# Patient Record
Sex: Male | Born: 1950 | Race: White | Hispanic: No | Marital: Married | Smoking: Never smoker
Health system: Southern US, Community
[De-identification: ages and names within clinical notes are randomized; demographics above are authoritative.]

## PROBLEM LIST (undated history)

## (undated) DIAGNOSIS — J302 Other seasonal allergic rhinitis: Secondary | ICD-10-CM

## (undated) DIAGNOSIS — I10 Essential (primary) hypertension: Secondary | ICD-10-CM

## (undated) DIAGNOSIS — K219 Gastro-esophageal reflux disease without esophagitis: Secondary | ICD-10-CM

## (undated) DIAGNOSIS — K449 Diaphragmatic hernia without obstruction or gangrene: Secondary | ICD-10-CM

## (undated) DIAGNOSIS — K648 Other hemorrhoids: Secondary | ICD-10-CM

## (undated) HISTORY — DX: Other seasonal allergic rhinitis: J30.2

## (undated) HISTORY — DX: Gastro-esophageal reflux disease without esophagitis: K21.9

## (undated) HISTORY — DX: Diaphragmatic hernia without obstruction or gangrene: K44.9

## (undated) HISTORY — DX: Other hemorrhoids: K64.8

## (undated) HISTORY — DX: Essential (primary) hypertension: I10

---

## 2004-01-04 HISTORY — PX: CHOLECYSTECTOMY: SHX55

## 2004-02-19 ENCOUNTER — Encounter: Admission: RE | Admit: 2004-02-19 | Discharge: 2004-02-19 | Payer: Self-pay | Admitting: Family Medicine

## 2004-03-11 ENCOUNTER — Encounter (INDEPENDENT_AMBULATORY_CARE_PROVIDER_SITE_OTHER): Payer: Self-pay | Admitting: Specialist

## 2004-03-12 ENCOUNTER — Inpatient Hospital Stay (HOSPITAL_COMMUNITY): Admission: RE | Admit: 2004-03-12 | Discharge: 2004-03-13 | Payer: Self-pay | Admitting: General Surgery

## 2004-05-05 ENCOUNTER — Ambulatory Visit: Payer: Self-pay | Admitting: Gastroenterology

## 2004-05-07 ENCOUNTER — Ambulatory Visit: Payer: Self-pay | Admitting: Gastroenterology

## 2004-06-02 ENCOUNTER — Encounter (INDEPENDENT_AMBULATORY_CARE_PROVIDER_SITE_OTHER): Payer: Self-pay | Admitting: Specialist

## 2004-06-02 ENCOUNTER — Ambulatory Visit: Payer: Self-pay | Admitting: Gastroenterology

## 2004-07-07 ENCOUNTER — Ambulatory Visit: Payer: Self-pay | Admitting: Gastroenterology

## 2004-08-27 ENCOUNTER — Ambulatory Visit: Payer: Self-pay | Admitting: Gastroenterology

## 2004-12-03 ENCOUNTER — Encounter: Admission: RE | Admit: 2004-12-03 | Discharge: 2004-12-03 | Payer: Self-pay | Admitting: Family Medicine

## 2005-02-07 ENCOUNTER — Encounter: Admission: RE | Admit: 2005-02-07 | Discharge: 2005-02-07 | Payer: Self-pay | Admitting: Family Medicine

## 2005-07-20 ENCOUNTER — Ambulatory Visit: Payer: Self-pay | Admitting: Gastroenterology

## 2005-09-13 ENCOUNTER — Encounter: Admission: RE | Admit: 2005-09-13 | Discharge: 2005-09-13 | Payer: Self-pay | Admitting: Family Medicine

## 2005-09-23 ENCOUNTER — Encounter: Admission: RE | Admit: 2005-09-23 | Discharge: 2005-12-22 | Payer: Self-pay | Admitting: Family Medicine

## 2006-05-10 ENCOUNTER — Ambulatory Visit: Payer: Self-pay | Admitting: Gastroenterology

## 2006-05-22 ENCOUNTER — Ambulatory Visit: Payer: Self-pay | Admitting: Gastroenterology

## 2006-07-12 ENCOUNTER — Ambulatory Visit: Payer: Self-pay | Admitting: Gastroenterology

## 2007-04-02 ENCOUNTER — Encounter: Admission: RE | Admit: 2007-04-02 | Discharge: 2007-04-02 | Payer: Self-pay | Admitting: Family Medicine

## 2007-04-09 ENCOUNTER — Encounter: Admission: RE | Admit: 2007-04-09 | Discharge: 2007-04-09 | Payer: Self-pay | Admitting: Family Medicine

## 2007-04-21 DIAGNOSIS — K219 Gastro-esophageal reflux disease without esophagitis: Secondary | ICD-10-CM

## 2007-04-21 DIAGNOSIS — K449 Diaphragmatic hernia without obstruction or gangrene: Secondary | ICD-10-CM | POA: Insufficient documentation

## 2007-04-21 DIAGNOSIS — K8020 Calculus of gallbladder without cholecystitis without obstruction: Secondary | ICD-10-CM | POA: Insufficient documentation

## 2007-04-21 DIAGNOSIS — M7660 Achilles tendinitis, unspecified leg: Secondary | ICD-10-CM | POA: Insufficient documentation

## 2007-04-21 DIAGNOSIS — K648 Other hemorrhoids: Secondary | ICD-10-CM | POA: Insufficient documentation

## 2007-07-17 ENCOUNTER — Encounter: Payer: Self-pay | Admitting: Gastroenterology

## 2007-07-23 ENCOUNTER — Ambulatory Visit: Payer: Self-pay | Admitting: Gastroenterology

## 2007-08-21 ENCOUNTER — Encounter: Admission: RE | Admit: 2007-08-21 | Discharge: 2007-08-21 | Payer: Self-pay | Admitting: Orthopedic Surgery

## 2008-04-19 ENCOUNTER — Emergency Department (HOSPITAL_COMMUNITY): Admission: EM | Admit: 2008-04-19 | Discharge: 2008-04-19 | Payer: Self-pay | Admitting: Emergency Medicine

## 2008-06-23 ENCOUNTER — Telehealth: Payer: Self-pay | Admitting: Gastroenterology

## 2008-06-24 ENCOUNTER — Encounter: Payer: Self-pay | Admitting: Gastroenterology

## 2008-09-12 ENCOUNTER — Ambulatory Visit: Payer: Self-pay | Admitting: Gastroenterology

## 2008-09-12 DIAGNOSIS — R141 Gas pain: Secondary | ICD-10-CM

## 2008-09-12 DIAGNOSIS — R143 Flatulence: Secondary | ICD-10-CM

## 2008-09-12 DIAGNOSIS — R142 Eructation: Secondary | ICD-10-CM

## 2008-11-13 ENCOUNTER — Ambulatory Visit (HOSPITAL_BASED_OUTPATIENT_CLINIC_OR_DEPARTMENT_OTHER): Admission: RE | Admit: 2008-11-13 | Discharge: 2008-11-13 | Payer: Self-pay | Admitting: Internal Medicine

## 2008-11-23 ENCOUNTER — Ambulatory Visit: Payer: Self-pay | Admitting: Internal Medicine

## 2009-07-17 ENCOUNTER — Encounter: Payer: Self-pay | Admitting: Gastroenterology

## 2009-09-28 ENCOUNTER — Ambulatory Visit: Payer: Self-pay | Admitting: Gastroenterology

## 2009-10-02 ENCOUNTER — Ambulatory Visit: Payer: Self-pay | Admitting: Family Medicine

## 2009-10-02 ENCOUNTER — Encounter: Payer: Self-pay | Admitting: Gastroenterology

## 2010-01-24 ENCOUNTER — Encounter: Payer: Self-pay | Admitting: Family Medicine

## 2010-01-27 ENCOUNTER — Encounter
Admission: RE | Admit: 2010-01-27 | Discharge: 2010-01-27 | Payer: Self-pay | Source: Home / Self Care | Attending: Internal Medicine | Admitting: Internal Medicine

## 2010-02-02 NOTE — Medication Information (Signed)
Summary: Approved Pantoprazole / [employer]  Approved Pantoprazole / [employer]   Imported By: Lennie Odor 07/21/2009 11:27:03  _____________________________________________________________________  External Attachment:    Type:   Image     Comment:   External Document

## 2010-02-02 NOTE — Miscellaneous (Signed)
Summary: BONE DENSITY  Clinical Lists Changes  Orders: Added new Test order of T-Lumbar Vertebral Assessment (77082) - Signed 

## 2010-02-02 NOTE — Assessment & Plan Note (Signed)
Summary: FOLLOW UP/RECEIVED A LETTER/YF   History of Present Illness Visit Type: Follow-up Visit Primary GI MD: Elie Goody MD [facility] Primary Provider: Larina Earthly, MD Requesting Provider: n/a Chief Complaint: Patient here for yearly f/u reflux. He denies any new GI problems at this time. History of Present Illness:   This is a 60 year old male with chronic GERD is maintained on pantoprazole twice daily. He states he recently tried to decrease to once daily and his symptoms flared. He underwent upper endoscopy in 2006 that showed a small hiatal hernia and was otherwise normal. He notes problems with frequent intestinal gas and flatus. A trial of Align did not improve his symptoms.   GI Review of Systems      Denies abdominal pain, acid reflux, belching, bloating, chest pain, dysphagia with liquids, dysphagia with solids, heartburn, loss of appetite, nausea, vomiting, vomiting blood, weight loss, and  weight gain.        Denies anal fissure, black tarry stools, change in bowel habit, constipation, diarrhea, diverticulosis, fecal incontinence, heme positive stool, hemorrhoids, irritable bowel syndrome, jaundice, light color stool, liver problems, rectal bleeding, and  rectal pain.   Current Medications (verified): 1)  Pantoprazole Sodium 40 Mg  Tbec (Pantoprazole Sodium) .... Take 1 Tablet By Mouth Two Times A Day 2)  Norvasc 10 Mg  Tabs (Amlodipine Besylate) .... Take 1 Tablet By Mouth Once A Day 3)  Lisinopril-Hydrochlorothiazide 20-12.5 Mg Tabs (Lisinopril-Hydrochlorothiazide) .... Take 1 Tablet By Mouth Once A Day 4)  Lyrica 50 Mg Caps (Pregabalin) .... Take 1 Tablet By Mouth Two Times A Day  Allergies (verified): 1)  Sulfa  Past History:  Past Medical History: GERD Hemorrhoids, internal HTN Asthma Sleep apnea Allergies Hiatal Hernia  Past Surgical History: Cholecystectomy 2006  Family History: Reviewed history from 09/12/2008 and no changes required. Ulcerative  Colitis-Father Family History of Heart Disease: Father, Mother No FH of Colon Cancer:  Social History: Reviewed history from 07/23/2007 and no changes required. Married Occupation: Education @ [employer] Patient has never smoked.  Alcohol Use - no Illicit Drug Use - no Patient does not get regular exercise.   Review of Systems       The patient complains of allergy/sinus and arthritis/joint pain.         The pertinent positives and negatives are noted as above and in the HPI. All other ROS were reviewed and were negative.   Vital Signs:  Patient profile:   60 year old male Height:      67.5 inches Weight:      202.38 pounds BMI:     31.34 BSA:     2.05 Pulse rate:   76 / minute Pulse rhythm:   regular BP sitting:   130 / 76  (right arm) Cuff size:   regular  Vitals Entered By: Lamona Curl CMA Duncan Dull) (September 28, 2009 3:06 PM)  Physical Exam  General:  Well developed, well nourished, no acute distress. Head:  Normocephalic and atraumatic. Eyes:  PERRLA, no icterus. Ears:  Normal auditory acuity. Mouth:  No deformity or lesions, dentition normal. Neck:  Supple; no masses or thyromegaly. Lungs:  Clear throughout to auscultation. Heart:  Regular rate and rhythm; no murmurs, rubs,  or bruits. Abdomen:  Soft, nontender and nondistended. No masses, hepatosplenomegaly or hernias noted. Normal bowel sounds. Msk:  Symmetrical with no gross deformities. Normal posture. Pulses:  Normal pulses noted. Extremities:  No clubbing, cyanosis, edema or deformities noted. Neurologic:  Alert and  oriented x4;  grossly normal neurologically. Cervical Nodes:  No significant cervical adenopathy. Inguinal Nodes:  No significant inguinal adenopathy. Psych:  Alert and cooperative. Normal mood and affect.  Impression & Recommendations:  Problem # 1:  GASTROESOPHAGEAL REFLUX DISEASE (ICD-530.81) Continue pantoprazole 40 mg twice daily, and standard antireflux measures. Rule out  osteopenia/osteoporosis, given long term PPI usage. Orders: T-Bone Densitometry [phone])  Problem # 2:  FLATULENCE-GAS-BLOATING (ICD-787.3) Begin a low gas diet. Trial of Gas-X q.i.d. p.r.n.  Problem # 3:  SCREENING COLORECTAL-CANCER (ICD-V76.51) Average risk for colorectal cancer. Screening colonoscopy due in May 2018.  Patient Instructions: 1)  Pick up your prescription from your pharmacy. 2)  Excessive Gas Diet handout given.  3)  Use Gas-X four times a day as needed. 4)  You have been scheduled for a Bone Density Scan. 5)  Copy sent to : Larina Earthly, MD 6)  The medication list was reviewed and reconciled.  All changed / newly prescribed medications were explained.  A complete medication list was provided to the patient / caregiver.  Prescriptions: PANTOPRAZOLE SODIUM 40 MG  TBEC (PANTOPRAZOLE SODIUM) Take 1 tablet by mouth two times a day  #60 x 11   Entered by:   Christie Nottingham CMA (AAMA)   Authorized by:   Meryl Dare MD [facility]   Signed by:   Christie Nottingham CMA (AAMA) on 09/28/2009   Method used:   Electronically to        [facility]  [address] [id number] (retail)       [address]       [place], [place]  [address]       Ph: [phone] or [phone]       Fax: [phone]   RxID:   [id number]

## 2010-05-18 NOTE — Assessment & Plan Note (Signed)
[facility]                         GASTROENTEROLOGY OFFICE NOTE   NAME:Faye, FARRIS GEIMAN                        MRN:          [id number]  DATE:07/12/2006                            DOB:          1950/03/22    Mr. [name] [name] returns for followup of GERD.  He states his reflux symptoms  are under very good control and he only has occasional breakthrough  symptoms.  Pantoprazole 40 mg twice a day appears to be an effective  regimen for him.  We reviewed the results of his recent colonoscopy that  only revealed small internal hemorrhoids which were likely the source of  his Hemoccult positive stools.   CURRENT MEDICATIONS:  Listed on the chart, updated and reviewed.   MEDICATION ALLERGIES:  SULFA DRUGS LEADING TO DYSPEPSIA.   EXAMINATION:  No acute distress, weight 188.4 pounds, blood pressure is  127/68, pulse 72 and regular.  He is not re-examined.   ASSESSMENT/PLAN:  1. Gastroesophageal reflux disease.  Continue standard anti-reflux      measures and pantoprazole 40 mg p.o. b.i.d.  2. Small internal hemorrhoids.  He may use an over the counter      hemorrhoidal suppository or cream on a p.r.n. basis.  3. Colorectal cancer screening. Recall colonoscopy in May 2018.     Venita Lick. Russella Dar, MD, [facility]  Electronically Signed    MTS/MedQ  DD: 07/14/2006  DT: 07/14/2006  Job #: [id number]   cc:   Quita Skye. Artis Flock, M.D.

## 2010-05-21 NOTE — Op Note (Signed)
[name] [name], [name] [name] NO.:  [id number]   MEDICAL RECORD NO.:  [id number]          PATIENT TYPE:  AMB   LOCATION:  DAY                          FACILITY:  [facility]   PHYSICIAN:  Angelia Mould. Derrell Lolling, M.D.DATE OF BIRTH:  03-08-1950   DATE OF PROCEDURE:  03/11/2004  DATE OF DISCHARGE:                                 OPERATIVE REPORT   PREOPERATIVE DIAGNOSIS:  Subacute cholecystitis with cholelithiasis.   POSTOPERATIVE DIAGNOSIS:  Subacute cholecystitis with cholelithiasis.   OPERATION PERFORMED:  Laparoscopic cholecystectomy with intraoperative  cholangiogram.   OPERATIVE INDICATIONS:  This is a 60 year old white man who has a long-  standing history of reflux symptoms.  These were getting worse about 6 weeks  ago.  An abdominal ultrasound was performed on February 19, 2004, which  shows multiple gallstones but no other abnormalities.  He also had an upper  GI, which showed a small hiatal hernia but no reflux.  He has been trying  proton pump inhibitors and H2 blockers, which did not help.  He has changed  his diet, which did help somewhat.  More recently, he has a 10-day history  of right-sided abdominal pain, mostly in the right upper quadrant.  It  radiates around to the back.  A little bit of nausea but no vomiting and no  fever.  I saw him in the office yesterday.  He had blood work, which showed  a white blood cell count of 14,000 and normal liver function tests.  He is  on antibiotics.  He did not look acutely ill, but because of the  leukocytosis and the mild tenderness, I felt it best to proceed with  cholecystectomy at this time.   OPERATIVE FINDINGS:  The gallbladder was a little bit thick walled but  mostly looked like chronic inflammatory changes.  The wall was not  particularly thickened.  There were a lot of adhesions to the gallbladder,  suggesting prior inflammatory episodes.  The gallbladder did contain  gallstones.  The anatomy of the cystic duct,  cystic artery, and common bile  duct was conventional.  The cholangiogram was normal except for one tiny  defect, which was completely spherical and looked like an air bubble in the  mid duct.  There was no obstruction.  The contrast went into the duodenum.  The intrahepatic and extrahepatic bile ducts looked normal.  The radiologist  read the cholangiogram as most likely air bubble but cannot rule out other  filling defect.  We did not feel that a common bile duct exploration was  warranted based on these findings.  The stomach, duodenum, liver, large  intestine, and small intestine were grossly normal.  We did look at the  right colon and the appendix specifically and there was no abnormality  there.  No signs of inflammatory process.   OPERATIVE TECHNIQUE:  Following the induction of general endotracheal  anesthesia, the patient's abdomen was prepped and draped in the sterile  fashion.  He was given intravenous antibiotics.  The patient was identified.  Marcaine, 0.5%, with epinephrine was used as a local infiltration  anesthetic.  A vertically oriented incision was made at the lower rim of the  umbilicus.  The fascia was incised in the midline and the abdominal cavity  entered under direct vision.  A 10 mm Hasson trocar was inserted and secured  with a purse-string suture of 0 Vicryl.  Pneumoperitoneum was created.  The  video camera was inserted with visualization and findings as described  above.  A 10 mm trocar was placed in the subxiphoid region and two 5 mm  trocars placed in the right mid abdomen.  The gallbladder fundus was  elevated.  We took down all of the adhesions slowly.  We identified the  infundibulum and retracted it laterally.  We then dissected out the cystic  duct and the cystic artery.  The cystic artery was isolated as it went onto  the gallbladder wall, secured with multiple metal clips, and divided.  This  then created a very nice window behind the cystic duct  and we had a very  good critical view.  The cystic duct was secured with the metal clip close  to the gallbladder.  A cholangiogram catheter was inserted into the cystic  duct.  A cholangiogram was obtained using the C-arm.  This showed normal  intrahepatic and extrahepatic biliary anatomy, prompt flow of contrast in  the duodenum.  There was a tiny 3 mm perfectly round defect in the mid  common bile duct, which looked like an air bubble.  The radiologist agreed  with this.  I felt that this could be followed symptomatically.  We did not  think that exploration of the common bile duct was indicated.  The  cholangiogram catheter was removed.  The cystic duct was secured with  multiple metal clips and divided.  The gallbladder was dissected from its  bed with electrocautery, placed in the specimen bag, and removed.  We had  spilled a little bit of bile from the gallbladder.  We spent a fair amount  of time irrigating and cleaning all of that up.  At the completion of the  case, the irrigation fluid was completely clear.  At the completion of the  case, there was absolutely no bleeding and no bile leak whatsoever.  All of  the areas of operative dissection and trocar site placements were inspected.  There was no bleeding and everything looked fine.  The trocars were removed.  The pneumoperitoneum was released.  The fascia at the umbilicus was closed  with 0 Vicryl sutures.  The skin incisions were closed with subcuticular  sutures of 4-0 Vicryl and Steri-Strips.  Clean bandages were placed and the  patient taken to the recovery room in stable condition.  Estimated blood  loss was about 20 cc.  Complications none.  Sponge and instrument counts  were correct.      HMI/MEDQ  D:  03/11/2004  T:  03/11/2004  Job:  [id number]   cc:   Vale Haven. Andrey Campanile, M.D.  [address]  [place]  [place] [address]  Fax: [phone]

## 2010-05-21 NOTE — Assessment & Plan Note (Signed)
[facility]                           GASTROENTEROLOGY OFFICE NOTE   NAME:[name] [name] HASLER                        MRN:          [id number]  DATE:07/20/2005                            DOB:          1950/02/25    This is a return office visit for GERD.  His reflux symptoms remain well  controlled on Protonix b.i.d. with the p.r.n. use of Tums.  He has no  dysphagia, odynophagia, abdominal pain, weight loss, or gastrointestinal  bleeding.  He has no abdominal pain, change in bowel habits, or change in  stool caliber.  There is no family history of colon cancer.  We have  previously recommended screening colonoscopy but he has not responded.  I  did discuss this again with him today.  Medications listed on the chart  updated and reviewed.   ALLERGIES:  SULFA DRUGS leading to dyspepsia.   PHYSICAL EXAMINATION:  GENERAL:  No acute distress.  VITAL SIGNS:  Blood pressure is 120/60, pulse 82 and regular.  CHEST:  Clear to auscultation bilaterally.  CARDIAC:  Regular rate and rhythm without murmurs.  ABDOMEN:  Soft and nontender with normoactive bowel sounds.  No palpable  organomegaly, masses, or hernias.   ASSESSMENT AND PLAN:  1.  Gastroesophageal reflux disease, well controlled on Protonix b.i.d.      Continue current regimen.  2.  Colorectal cancer screening.  The patient declines colonoscopy, barium      enema, and virtual colonoscopy at this time.  He states he will consider      it again in the future.  His primary physician is now Dr. Bradd Canary.      Return office visit with me in one year.                                   Venita Lick. Pleas Koch., MD, [facility]   MTS/MedQ  DD:  07/20/2005  DT:  07/20/2005  Job #:  [id number]

## 2010-09-25 ENCOUNTER — Other Ambulatory Visit: Payer: Self-pay | Admitting: Gastroenterology

## 2010-10-03 ENCOUNTER — Other Ambulatory Visit: Payer: Self-pay | Admitting: Gastroenterology

## 2010-10-04 NOTE — Telephone Encounter (Signed)
NEEDS OFFICE VISIT FOR ANY FURTHER REFILLS! 

## 2011-01-19 ENCOUNTER — Other Ambulatory Visit: Payer: Self-pay | Admitting: Internal Medicine

## 2011-01-30 ENCOUNTER — Other Ambulatory Visit: Payer: Self-pay | Admitting: Internal Medicine

## 2011-02-03 ENCOUNTER — Telehealth: Payer: Self-pay | Admitting: Gastroenterology

## 2011-02-03 MED ORDER — PANTOPRAZOLE SODIUM 40 MG PO TBEC: 40.0000 mg | DELAYED_RELEASE_TABLET | Freq: Two times a day (BID) | ORAL | Status: DC

## 2011-02-03 NOTE — Telephone Encounter (Signed)
One refill sent to his pharmacy until scheduled office visit.

## 2011-02-21 ENCOUNTER — Encounter: Payer: Self-pay | Admitting: Gastroenterology

## 2011-02-21 ENCOUNTER — Ambulatory Visit (INDEPENDENT_AMBULATORY_CARE_PROVIDER_SITE_OTHER): Payer: BC Managed Care – PPO | Admitting: Gastroenterology

## 2011-02-21 VITALS — BP 110/60 | HR 62 | Ht 67.0 in | Wt 200.8 lb

## 2011-02-21 DIAGNOSIS — Z1211 Encounter for screening for malignant neoplasm of colon: Secondary | ICD-10-CM

## 2011-02-21 DIAGNOSIS — K219 Gastro-esophageal reflux disease without esophagitis: Secondary | ICD-10-CM

## 2011-02-21 MED ORDER — PANTOPRAZOLE SODIUM 40 MG PO TBEC: 40.0000 mg | DELAYED_RELEASE_TABLET | Freq: Two times a day (BID) | ORAL | Status: DC

## 2011-02-21 NOTE — Progress Notes (Signed)
History of Present Illness: This is a 61 year old male with a history of GERD that is controlled on pantoprazole 40 mg twice daily he has attempted on several occasions over the years to decrease the pantoprazole once daily but has significant breakthrough symptoms and returns to twice daily therapy. He uses TUMS as needed. He has infrequent episodes of intestinal gas. Denies weight loss, abdominal pain, constipation, diarrhea, change in stool caliber, melena, hematochezia, nausea, vomiting, dysphagia, chest pain.  Current Medications, Allergies, Past Medical History, Past Surgical History, Family History and Social History were reviewed in [employer] record.  Physical Exam: General: Well developed , well nourished, no acute distress Head: Normocephalic and atraumatic Eyes:  sclerae anicteric, EOMI Ears: Normal auditory acuity Mouth: No deformity or lesions Lungs: Clear throughout to auscultation Heart: Regular rate and rhythm; no murmurs, rubs or bruits Abdomen: Soft, non tender and non distended. No masses, hepatosplenomegaly or hernias noted. Normal Bowel sounds Musculoskeletal: Symmetrical with no gross deformities  Extremities: No clubbing, cyanosis, edema or deformities noted Neurological: Alert oriented x 4, grossly nonfocal Psychological:  Alert and cooperative. Normal mood and affect  Assessment and Recommendations:  1. GERD. Continue pantoprazole 40 mg twice daily, TUMS when necessary and standard antireflux measures.  2. Colorectal cancer screening. Average risk. Screening colonoscopy is due May 2018.

## 2011-02-21 NOTE — Patient Instructions (Addendum)
Your prescription for Protonix has been sent to the phamacy.  cc: Ravisankar Awa M.D.

## 2011-08-19 ENCOUNTER — Encounter (INDEPENDENT_AMBULATORY_CARE_PROVIDER_SITE_OTHER): Payer: Self-pay | Admitting: General Surgery

## 2011-08-19 ENCOUNTER — Ambulatory Visit (INDEPENDENT_AMBULATORY_CARE_PROVIDER_SITE_OTHER): Payer: BC Managed Care – PPO | Admitting: General Surgery

## 2011-08-19 VITALS — BP 120/80 | HR 56 | Temp 97.4°F | Resp 16 | Ht 67.0 in | Wt 194.0 lb

## 2011-08-19 DIAGNOSIS — K645 Perianal venous thrombosis: Secondary | ICD-10-CM

## 2011-08-19 NOTE — Progress Notes (Signed)
Subjective:     Patient ID: [name] [name], male   DOB: [birth year], 61 y.o.   MRN: [id number]  HPI Patient complains of thrombosed external hemorrhoids with significant pain for the past 2 days. He was seen at his primary care office and referred to urgent clinic. He has had no bleeding. He has had no significant recent constipation.  Review of Systems     Objective:   Physical Exam  HENT:       Wears glasses  Cardiovascular: Normal rate and regular rhythm.   Pulmonary/Chest: Effort normal. No respiratory distress.   External anal exam reveals 2 very large thrombosed external hemorrhoids on the right side. Area was prepped in a sterile fashion. Local anesthetic was injected. An incision was made over both with expulsion of the large clots spontaneously. Further clots were cleaned out. Hemostasis was obtained with pressure and a gauze dressing was applied. He tolerated this well.    Assessment:     Thrombosed external hemorrhoid x2    Plan:     Excision as above. Wound care instructions given. Return p.r.n.

## 2011-08-22 ENCOUNTER — Telehealth (INDEPENDENT_AMBULATORY_CARE_PROVIDER_SITE_OTHER): Payer: Self-pay

## 2011-08-22 NOTE — Telephone Encounter (Signed)
Patient called with a couple questions from his throm hem excision on Friday. He states there is still a small area that is hanging down and asked if it was normal. I told him that Dr. Janee Morn cut the hemorrhoids open and pulled out the blood clots. So the area that was holding the clots is probably still down there. I told him to do the sitz baths still. He also inquired about pain medicine. He currently is not taking anything. I told him to try ibuprofen because a narcotic could possibly constipate him and since he has hemorrhoid problems the last thing he needs is to be constipated for risk of continued hemorrhoidal problems. Patient understood and will call back with any questions, concerns or worsening symptoms.

## 2011-09-09 ENCOUNTER — Telehealth: Payer: Self-pay | Admitting: Gastroenterology

## 2011-09-09 NOTE — Telephone Encounter (Signed)
Called and got patient's PA covered today. Faxed the approved form to patient's pharmacy. Pt notified.

## 2012-02-24 ENCOUNTER — Other Ambulatory Visit: Payer: Self-pay | Admitting: Gastroenterology

## 2012-02-24 NOTE — Telephone Encounter (Signed)
NEEDS OFFICE VISIT FOR ANY FURTHER REFILLS! 

## 2012-04-07 ENCOUNTER — Other Ambulatory Visit: Payer: Self-pay | Admitting: Gastroenterology

## 2012-04-09 NOTE — Telephone Encounter (Signed)
NEEDS OFFICE VISIT FOR ANY FURTHER REFILLS! 

## 2012-05-10 ENCOUNTER — Telehealth: Payer: Self-pay | Admitting: Gastroenterology

## 2012-05-10 ENCOUNTER — Other Ambulatory Visit: Payer: Self-pay | Admitting: Gastroenterology

## 2012-05-10 MED ORDER — PANTOPRAZOLE SODIUM 40 MG PO TBEC: DELAYED_RELEASE_TABLET | ORAL | Status: DC

## 2012-05-10 NOTE — Telephone Encounter (Signed)
Refilled medicine for one month until scheduled appt. Told patient to keep appt for any further refills.

## 2012-05-23 ENCOUNTER — Ambulatory Visit (INDEPENDENT_AMBULATORY_CARE_PROVIDER_SITE_OTHER): Payer: BC Managed Care – PPO | Admitting: Gastroenterology

## 2012-05-23 ENCOUNTER — Encounter: Payer: Self-pay | Admitting: Gastroenterology

## 2012-05-23 VITALS — BP 100/56 | HR 56 | Ht 67.0 in | Wt 191.4 lb

## 2012-05-23 DIAGNOSIS — K219 Gastro-esophageal reflux disease without esophagitis: Secondary | ICD-10-CM

## 2012-05-23 MED ORDER — PANTOPRAZOLE SODIUM 40 MG PO TBEC: DELAYED_RELEASE_TABLET | ORAL | Status: DC

## 2012-05-23 MED ORDER — HYOSCYAMINE SULFATE 0.125 MG SL SUBL: SUBLINGUAL_TABLET | SUBLINGUAL | Status: DC

## 2012-05-23 NOTE — Patient Instructions (Addendum)
We have sent the following medications to your pharmacy for you to pick up at your convenience: Levsin and Protonix.  Thank you for choosing me and  Gastroenterology.  Venita Lick. Pleas Koch., MD., [facility]

## 2012-05-23 NOTE — Progress Notes (Signed)
History of Present Illness: This is a 62 year old male with chronic GERD that is well controlled on pantoprazole 40 twice daily. He relates a history of episodic diarrhea since about 62 years of age that is clearly related to certain food intake. Milk products, caffeine, spicy foods and red meats frequently bring on symptoms of gas cramping and diarrhea that resolves within a few hours  Current Medications, Allergies, Past Medical History, Past Surgical History, Family History and Social History were reviewed in [employer] record.  Physical Exam: General: Well developed , well nourished, no acute distress Head: Normocephalic and atraumatic Eyes:  sclerae anicteric, EOMI Ears: Normal auditory acuity Mouth: No deformity or lesions Lungs: Clear throughout to auscultation Heart: Regular rate and rhythm; no murmurs, rubs or bruits Abdomen: Soft, non tender and non distended. No masses, hepatosplenomegaly or hernias noted. Normal Bowel sounds Musculoskeletal: Symmetrical with no gross deformities  Pulses:  Normal pulses noted Extremities: No clubbing, cyanosis, edema or deformities noted Neurological: Alert oriented x 4, grossly nonfocal Psychological:  Alert and cooperative. Normal mood and affect  Assessment and Recommendations:  1. GERD. Continue pantoprazole 40 mg twice daily, TUMS when necessary and standard antireflux measures.   2. Colorectal cancer screening. Average risk. Screening colonoscopy is due May 2018.  3. Episodic diarrhea with related to food intolerances. Avoidance or at least minimizing these foods is recommended. Levsin as needed for symptoms and prophylactically prior to stressor foods.

## 2012-06-03 ENCOUNTER — Ambulatory Visit (INDEPENDENT_AMBULATORY_CARE_PROVIDER_SITE_OTHER): Payer: BC Managed Care – PPO | Admitting: Physician Assistant

## 2012-06-03 VITALS — BP 118/65 | HR 50 | Temp 97.9°F | Resp 12 | Ht 67.0 in | Wt 192.0 lb

## 2012-06-03 DIAGNOSIS — H902 Conductive hearing loss, unspecified: Secondary | ICD-10-CM

## 2012-06-03 DIAGNOSIS — H6120 Impacted cerumen, unspecified ear: Secondary | ICD-10-CM

## 2012-06-03 DIAGNOSIS — H6123 Impacted cerumen, bilateral: Secondary | ICD-10-CM

## 2012-06-03 NOTE — Patient Instructions (Addendum)
Discontinue use of Q-tips, hair pins, keys, etc.   To help prevent cerumen impaction: While in the washing hair in the shower, allow soapy water to run into ear canal for a few minutes and then rinse (it dissolves the wax like a dishwasher dissolves grease).   May also use half hydrogen peroxide half water in the shower 2-3 times per week to help rinse out the wax. DO NOT USE 100% HYDROGEN PEROXIDE (this can burn the skin).   Also, several drops of sweet oil can be used to help prevent itching of the canal. If this is not enough to decrease itch, you may put a small amount of OTC hydrocortisone cream on pinky finger and apply to portion of canal that can be reached with tip of finger.  Recommend OTC Colace to prevent impaction.

## 2012-06-03 NOTE — Progress Notes (Signed)
  Subjective:    Patient ID: [name] [name], male    DOB: January 04, 1951, 62 y.o.   MRN: [id number]  HPI 62 year old male presents with cerumen impaction. Does have a significant history of cerumen impaction that does require irrigation on occasion. He does use Debrox at home and also irrigates with a syringe. He has been doing this without success and was unable to get in to his PCP so he has presented here.  Denies otalgia, URI sx's, dizziness, or headache.   Otherwise doing well with no other complaints today.     Review of Systems  Constitutional: Negative for fever and chills.  HENT: Negative for ear pain.   Neurological: Negative for dizziness and headaches.       Objective:   Physical Exam  Constitutional: He is oriented to person, place, and time. He appears well-developed and well-nourished.  HENT:  Head: Normocephalic and atraumatic.  Right Ear: External ear and ear canal normal.  Left Ear: External ear and ear canal normal.  Bilateral cerumen impaction. Normal TM's visualized s/p irrigation  Cardiovascular: Normal rate.   Pulmonary/Chest: Effort normal.  Neurological: He is alert and oriented to person, place, and time.  Psychiatric: He has a normal mood and affect. His behavior is normal. Judgment and thought content normal.      Patient reports improvement in symptoms after irrigation.     Assessment & Plan:  Cerumen impaction, bilateral  Conductive hearing loss  Ear care instructions provided Follow up as needed.

## 2012-12-01 ENCOUNTER — Ambulatory Visit (INDEPENDENT_AMBULATORY_CARE_PROVIDER_SITE_OTHER): Payer: BC Managed Care – PPO | Admitting: Internal Medicine

## 2012-12-01 VITALS — BP 128/72 | HR 58 | Temp 98.6°F | Resp 14 | Ht 67.0 in | Wt 189.0 lb

## 2012-12-01 DIAGNOSIS — R05 Cough: Secondary | ICD-10-CM

## 2012-12-01 DIAGNOSIS — J019 Acute sinusitis, unspecified: Secondary | ICD-10-CM

## 2012-12-01 DIAGNOSIS — I10 Essential (primary) hypertension: Secondary | ICD-10-CM | POA: Insufficient documentation

## 2012-12-01 DIAGNOSIS — Z6829 Body mass index (BMI) 29.0-29.9, adult: Secondary | ICD-10-CM

## 2012-12-01 MED ORDER — LIDOCAINE VISCOUS 2 % MT SOLN: OROMUCOSAL | Status: DC

## 2012-12-01 MED ORDER — AMOXICILLIN 500 MG PO CAPS: 1000.0000 mg | ORAL_CAPSULE | Freq: Two times a day (BID) | ORAL | Status: AC

## 2012-12-01 NOTE — Progress Notes (Deleted)
   Subjective:    Patient ID: [name] [name], male    DOB: 1950-03-12, 62 y.o.   MRN: [id number]  HPI    Review of Systems     Objective:   Physical Exam        Assessment & Plan:

## 2012-12-02 DIAGNOSIS — Z6829 Body mass index (BMI) 29.0-29.9, adult: Secondary | ICD-10-CM | POA: Insufficient documentation

## 2012-12-02 NOTE — Progress Notes (Signed)
   Subjective:    Patient ID: [name] [name], male    DOB: [birth year], 62 y.o.   MRN: [id number]  HPI 2 wek hx congestion/purulent mucus/nonpro cough No fever Hoarse at times No AR  Patient Active Problem List   Diagnosis Date Noted  . BMI 29.0-29.9,adult 12/02/2012  . HTN (hypertension) 12/01/2012  . FLATULENCE-GAS-BLOATING 09/12/2008  . HEMORRHOIDS, INTERNAL 04/21/2007  . GASTROESOPHAGEAL REFLUX DISEASE 04/21/2007  . HIATAL HERNIA 04/21/2007  . CHOLELITHIASIS 04/21/2007  . ACHILLES BURSITIS OR TENDINITIS 04/21/2007      Review of Systems    noncontr Objective:   Physical Exam BP 128/72  Pulse 58  Temp(Src) 98.6 F (37 C)  Resp 14  Ht 5\' 7"  (1.702 m)  Wt 189 lb (85.73 kg)  BMI 29.59 kg/m2  SpO2 98% Tms cl Nares boggy/purulent Tend max sin R thr cl No nodes Chest clear        Assessment & Plan:  Acute sinusitis, unspecified  BMI 29.0-29.9,adult  Meds ordered this encounter  Medications  . amoxicillin (AMOXIL) 500 MG capsule    Sig: Take 2 capsules (1,000 mg total) by mouth 2 (two) times daily.    Dispense:  40 capsule    Refill:  0  . lidocaine (XYLOCAINE) 2 % solution    Sig: gargle with 1/2-1 tsp q 2-3 hrs as pain control    Dispense:  100 mL    Refill:  0

## 2013-05-28 ENCOUNTER — Ambulatory Visit (INDEPENDENT_AMBULATORY_CARE_PROVIDER_SITE_OTHER): Payer: BC Managed Care – PPO | Admitting: Gastroenterology

## 2013-05-28 ENCOUNTER — Encounter: Payer: Self-pay | Admitting: Gastroenterology

## 2013-05-28 VITALS — BP 118/70 | HR 60 | Ht 67.0 in | Wt 190.4 lb

## 2013-05-28 DIAGNOSIS — K219 Gastro-esophageal reflux disease without esophagitis: Secondary | ICD-10-CM

## 2013-05-28 DIAGNOSIS — K589 Irritable bowel syndrome without diarrhea: Secondary | ICD-10-CM

## 2013-05-28 MED ORDER — PANTOPRAZOLE SODIUM 40 MG PO TBEC: DELAYED_RELEASE_TABLET | ORAL | Status: AC

## 2013-05-28 NOTE — Progress Notes (Signed)
    History of Present Illness: This is a 63 year old male with GERD and IBS. His PCP started Effexor and his IBS symptoms are under better control. His reflux symptoms are under excellent control.   Current Medications, Allergies, Past Medical History, Past Surgical History, Family History and Social History were reviewed in [employer] record.  Physical Exam: General: Well developed , well nourished, no acute distress Head: Normocephalic and atraumatic Eyes:  sclerae anicteric, EOMI Ears: Normal auditory acuity Mouth: No deformity or lesions Lungs: Clear throughout to auscultation Heart: Regular rate and rhythm; no murmurs, rubs or bruits Abdomen: Soft, non tender and non distended. No masses, hepatosplenomegaly or hernias noted. Normal Bowel sounds Musculoskeletal: Symmetrical with no gross deformities  Pulses:  Normal pulses noted Extremities: No clubbing, cyanosis, edema or deformities noted Neurological: Alert oriented x 4, grossly nonfocal Psychological:  Alert and cooperative. Normal mood and affect  Assessment and Recommendations:  1. GERD. Continue standard antireflux measures and pantoprazole 40 mg twice daily.  2. IBS. Symptoms improved on Effexor. Continue hyoscyamine as needed.

## 2013-05-28 NOTE — Patient Instructions (Signed)
We have sent the following medications to your pharmacy for you to pick up at your convenience: Protonix.  Thank you for choosing me and [facility].  Malcolm T. Stark, Jr., MD., FACG   

## 2013-07-07 ENCOUNTER — Other Ambulatory Visit: Payer: Self-pay | Admitting: Gastroenterology

## 2016-03-30 ENCOUNTER — Encounter: Payer: Self-pay | Admitting: Gastroenterology

## 2017-01-24 IMAGING — CR FOOT LT 2 VWS
1 series · 2 of 2 positions shown · non-contrast
Comparison: None

Left foot 2 views weightbearing
HISTORY: Pain

[Series 1: lat · 0.17mm/px · 2 of 2 slices shown]
[im 1/2]
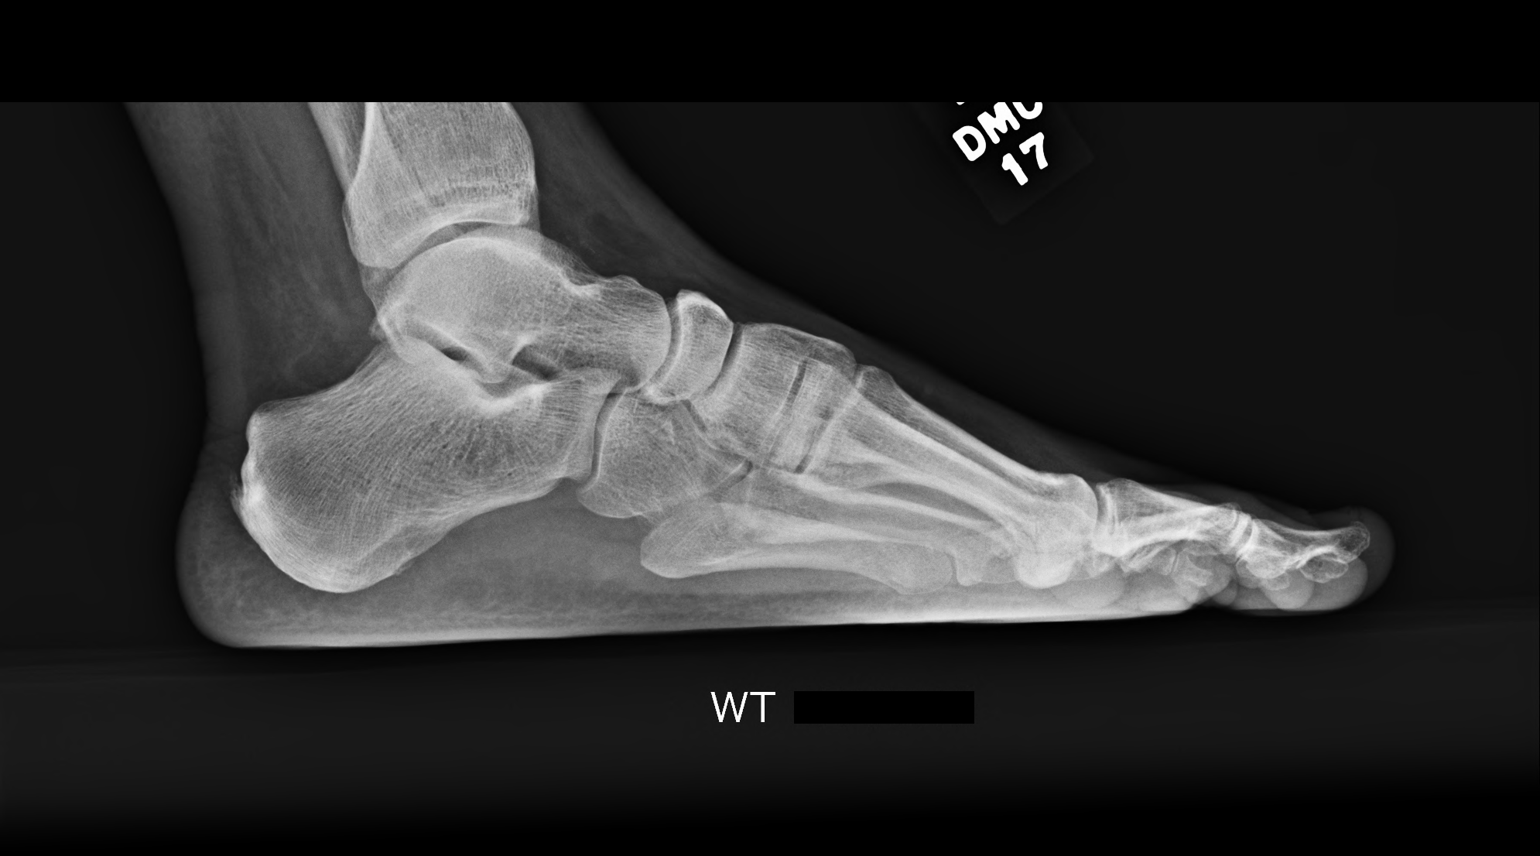
[im 2/2]
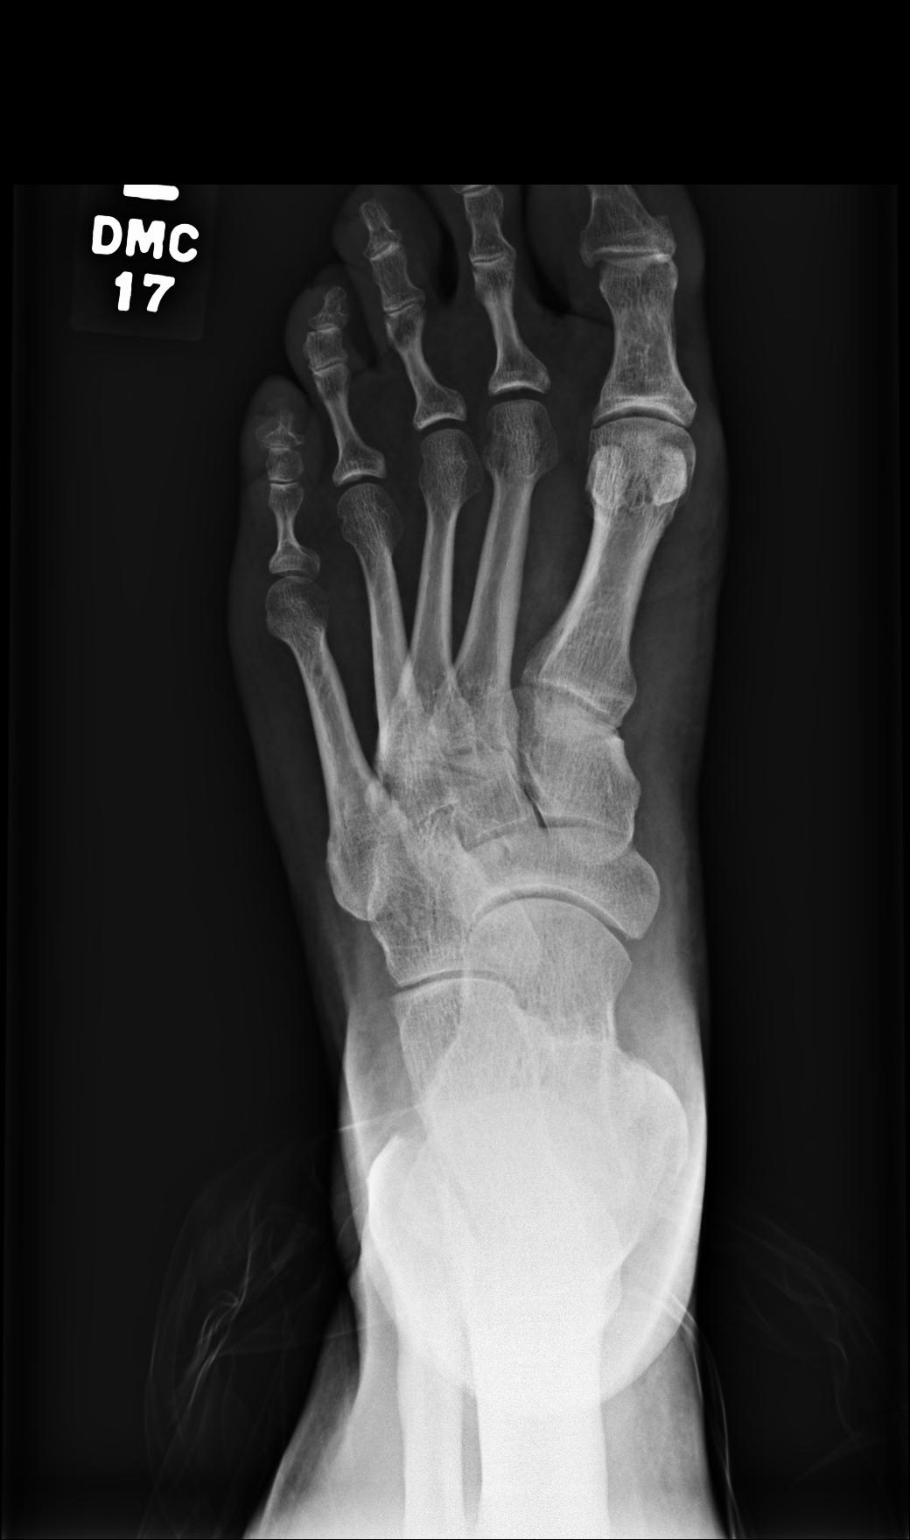

[2 of 2 positions shown; findings below may reference images not displayed]

FINDINGS: No bony or articular abnormality is identified. In particular, there is no evidence of fracture, dislocation, or radiopaque foreign body.
IMPRESSION: Normal left foot.

## 2022-11-24 IMAGING — CR CHEST 2 VWS PA LAT
1 series · 2 of 2 positions shown · non-contrast
Comparison: None

HISTORY/INDICATIONS:  Shortness of breath
TECHNIQUE: Chest, two views.

[Series 1: w chest pa · 0.15mm/px · 2 of 2 slices shown]
[im 1/2]
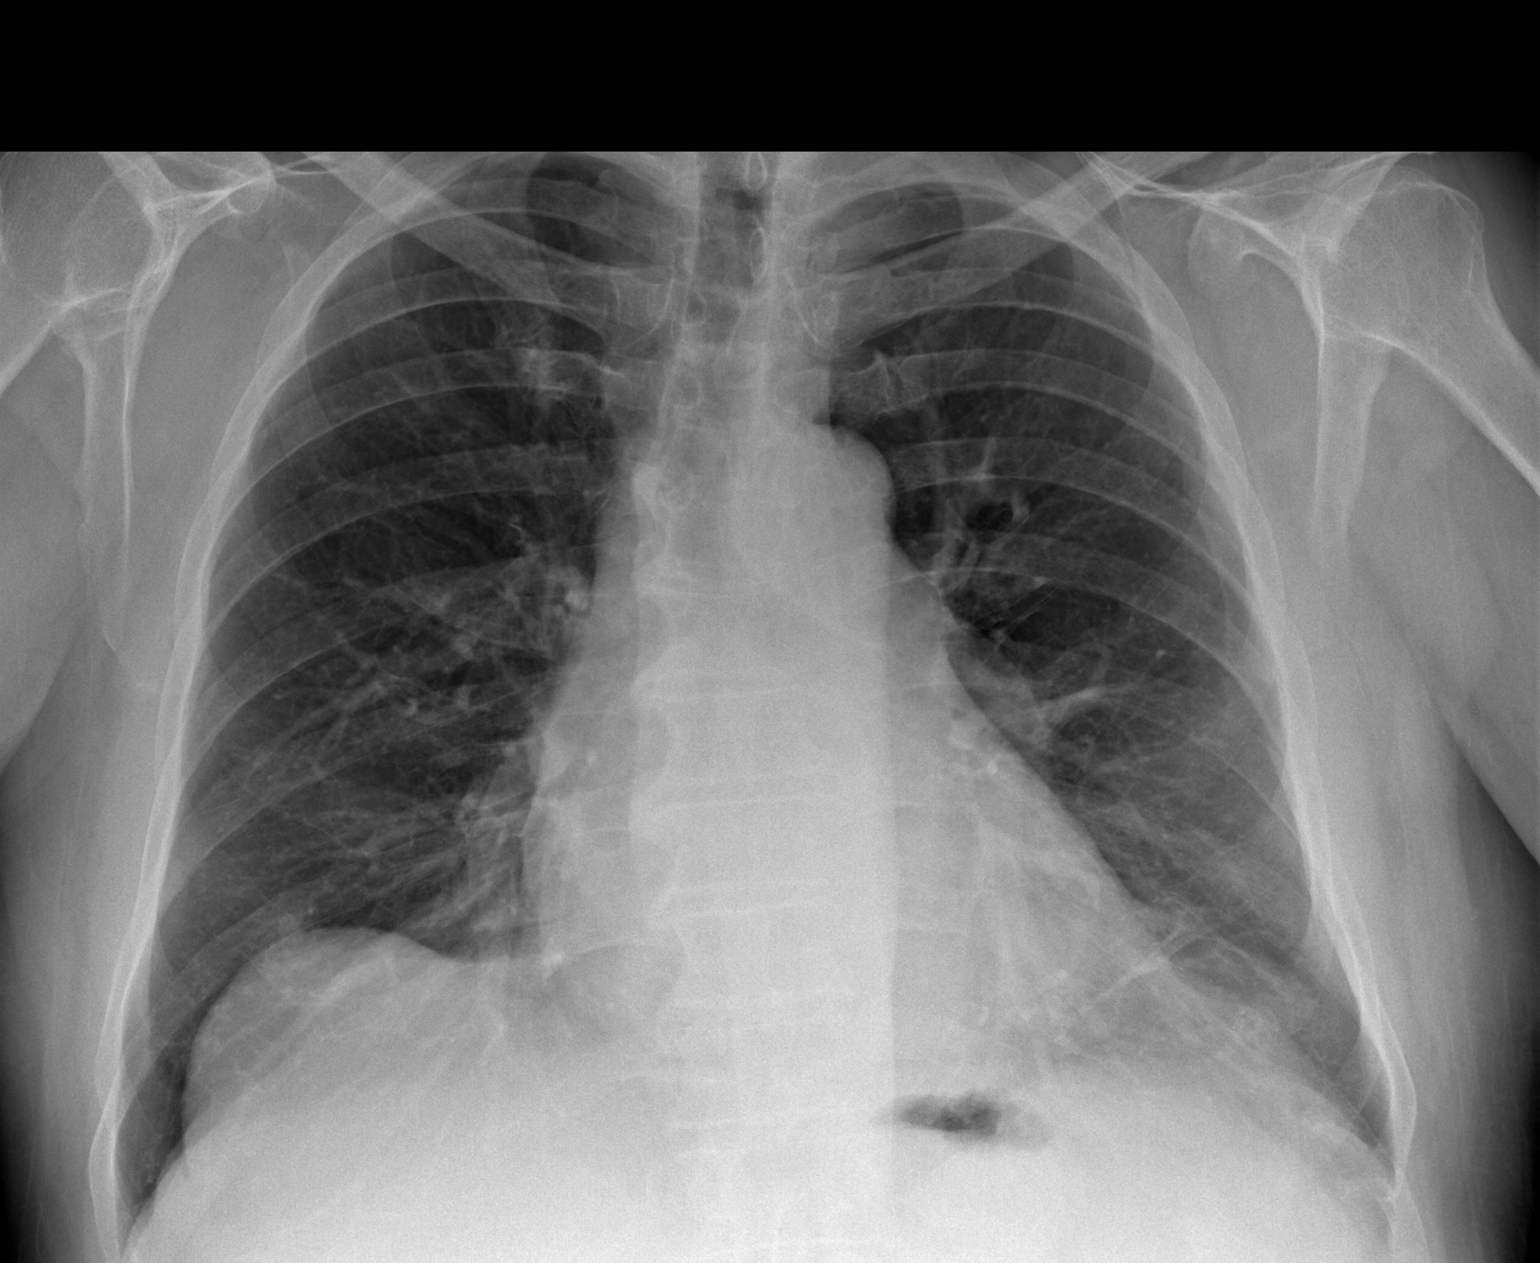
[im 2/2]
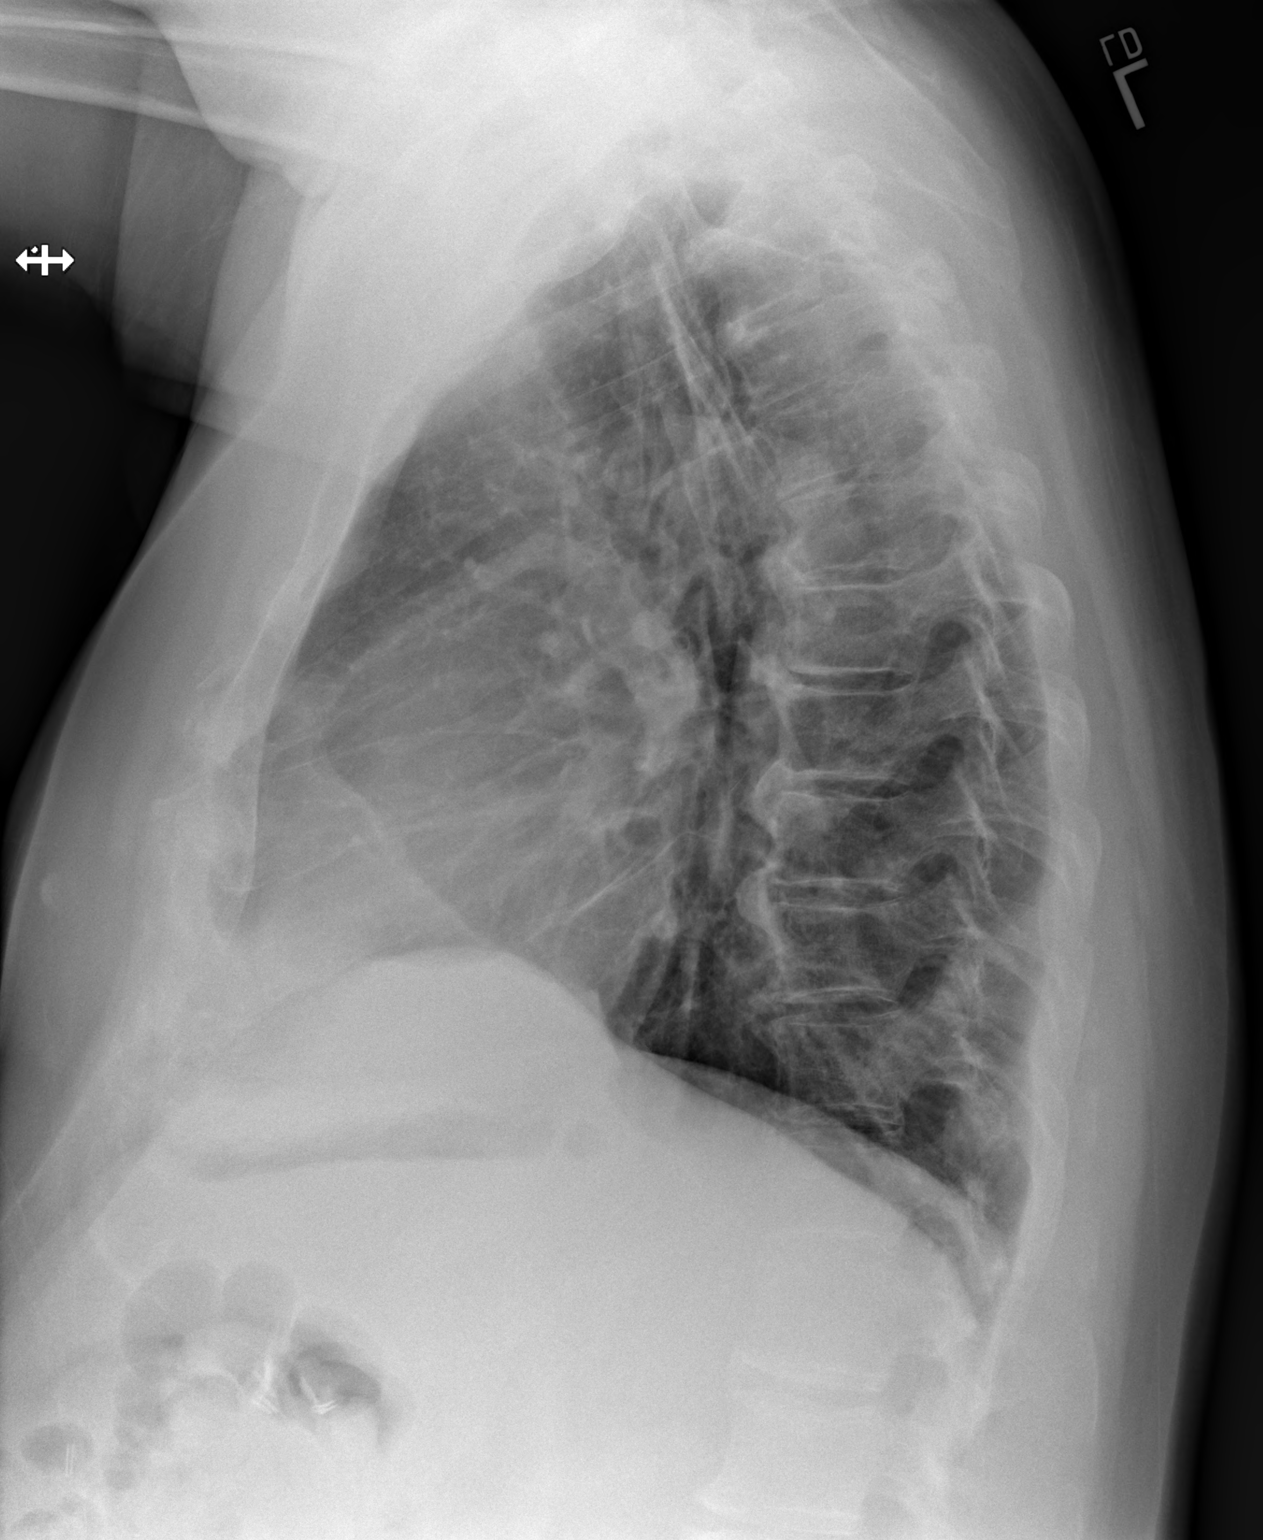

[2 of 2 positions shown; findings below may reference images not displayed]

FINDINGS: Cardiac size and pulmonary vascularity are normal.  The mediastinal silhouette is unremarkable.  The lungs are normal, and there is no pleural fluid. Minimal basilar fibrotic stranding. Right hemidiaphragm eventration. Cholecystectomy.

Right basilar nipple shadow seen on two views.
IMPRESSION: No acute disease.
